# Patient Record
Sex: Female | Born: 1990 | Race: White | Hispanic: No | Marital: Single | Smoking: Current every day smoker
Health system: Southern US, Community
[De-identification: ages and names within clinical notes are randomized; demographics above are authoritative.]

## PROBLEM LIST (undated history)

## (undated) DIAGNOSIS — Q893 Situs inversus: Secondary | ICD-10-CM

## (undated) HISTORY — PX: CARDIAC SURGERY: SHX584

## (undated) HISTORY — PX: HAND SURGERY: SHX662

---

## 2015-08-10 ENCOUNTER — Emergency Department: Payer: Self-pay

## 2015-08-10 ENCOUNTER — Emergency Department
Admission: EM | Admit: 2015-08-10 | Discharge: 2015-08-10 | Disposition: A | Discharge status: Home / Self Care | Payer: Self-pay | Attending: Emergency Medicine | Admitting: Emergency Medicine

## 2015-08-10 ENCOUNTER — Encounter: Payer: Self-pay | Admitting: Emergency Medicine

## 2015-08-10 DIAGNOSIS — M25441 Effusion, right hand: Secondary | ICD-10-CM

## 2015-08-10 DIAGNOSIS — S0990XA Unspecified injury of head, initial encounter: Secondary | ICD-10-CM

## 2015-08-10 DIAGNOSIS — F17210 Nicotine dependence, cigarettes, uncomplicated: Secondary | ICD-10-CM | POA: Insufficient documentation

## 2015-08-10 DIAGNOSIS — Y999 Unspecified external cause status: Secondary | ICD-10-CM | POA: Insufficient documentation

## 2015-08-10 DIAGNOSIS — Y929 Unspecified place or not applicable: Secondary | ICD-10-CM | POA: Insufficient documentation

## 2015-08-10 DIAGNOSIS — M79641 Pain in right hand: Secondary | ICD-10-CM | POA: Insufficient documentation

## 2015-08-10 DIAGNOSIS — S60221A Contusion of right hand, initial encounter: Secondary | ICD-10-CM | POA: Insufficient documentation

## 2015-08-10 DIAGNOSIS — Y9389 Activity, other specified: Secondary | ICD-10-CM | POA: Insufficient documentation

## 2015-08-10 HISTORY — DX: Situs inversus: Q89.3

## 2015-08-10 MED ORDER — ACETAMINOPHEN 325 MG PO TABS
650.0000 mg | ORAL_TABLET | Freq: Once | ORAL | Status: AC
  Administered 2015-08-10: 650 mg via ORAL
  Filled 2015-08-10: qty 2

## 2015-08-10 NOTE — Discharge Instructions (Signed)
Contusion °A contusion is a deep bruise. Contusions are the result of a blunt injury to tissues and muscle fibers under the skin. The injury causes bleeding under the skin. The skin overlying the contusion may turn blue, purple, or yellow. Minor injuries will give you a painless contusion, but more severe contusions may stay painful and swollen for a few weeks.  °CAUSES  °This condition is usually caused by a blow, trauma, or direct force to an area of the body. °SYMPTOMS  °Symptoms of this condition include: °· Swelling of the injured area. °· Pain and tenderness in the injured area. °· Discoloration. The area may have redness and then turn blue, purple, or yellow. °DIAGNOSIS  °This condition is diagnosed based on a physical exam and medical history. An X-ray, CT scan, or MRI may be needed to determine if there are any associated injuries, such as broken bones (fractures). °TREATMENT  °Specific treatment for this condition depends on what area of the body was injured. In general, the best treatment for a contusion is resting, icing, applying pressure to (compression), and elevating the injured area. This is often called the RICE strategy. Over-the-counter anti-inflammatory medicines may also be recommended for pain control.  °HOME CARE INSTRUCTIONS  °· Rest the injured area. °· If directed, apply ice to the injured area: °· Put ice in a plastic bag. °· Place a towel between your skin and the bag. °· Leave the ice on for 20 minutes, 2-3 times per day. °· If directed, apply light compression to the injured area using an elastic bandage. Make sure the bandage is not wrapped too tightly. Remove and reapply the bandage as directed by your health care provider. °· If possible, raise (elevate) the injured area above the level of your heart while you are sitting or lying down. °· Take over-the-counter and prescription medicines only as told by your health care provider. °SEEK MEDICAL CARE IF: °· Your symptoms do not  improve after several days of treatment. °· Your symptoms get worse. °· You have difficulty moving the injured area. °SEEK IMMEDIATE MEDICAL CARE IF:  °· You have severe pain. °· You have numbness in a hand or foot. °· Your hand or foot turns pale or cold. °  °This information is not intended to replace advice given to you by your health care provider. Make sure you discuss any questions you have with your health care provider. °  °Document Released: 11/05/2004 Document Revised: 10/17/2014 Document Reviewed: 06/13/2014 °Elsevier Interactive Patient Education ©2016 [employer] ° °Head Injury, Adult °You have a head injury. Headaches and throwing up (vomiting) are common after a head injury. It should be easy to wake up from sleeping. Sometimes you must stay in the hospital. Most problems happen within the first 24 hours. Side effects may occur up to 7-10 days after the injury.  °WHAT ARE THE TYPES OF HEAD INJURIES? °Head injuries can be as minor as a bump. Some head injuries can be more severe. More severe head injuries include: °· A jarring injury to the brain (concussion). °· A bruise of the brain (contusion). This mean there is bleeding in the brain that can cause swelling. °· A cracked skull (skull fracture). °· Bleeding in the brain that collects, clots, and forms a bump (hematoma). °WHEN SHOULD I GET HELP RIGHT AWAY?  °· You are confused or sleepy. °· You cannot be woken up. °· You feel sick to your stomach (nauseous) or keep throwing up (vomiting). °· Your dizziness or unsteadiness   is getting worse. °· You have very bad, lasting headaches that are not helped by medicine. Take medicines only as told by your doctor. °· You cannot use your arms or legs like normal. °· You cannot walk. °· You notice changes in the black spots in the center of the colored part of your eye (pupil). °· You have clear or bloody fluid coming from your nose or ears. °· You have trouble seeing. °During the next 24 hours after the  injury, you must stay with someone who can watch you. This person should get help right away (call 911 in the [place] if you start to shake and are not able to control it (have seizures), you pass out, or you are unable to wake up. °HOW CAN I PREVENT A HEAD INJURY IN THE FUTURE? °· Wear seat belts. °· Wear a helmet while bike riding and playing sports like football. °· Stay away from dangerous activities around the house. °WHEN CAN I RETURN TO NORMAL ACTIVITIES AND ATHLETICS? °See your doctor before doing these activities. You should not do normal activities or play contact sports until 1 week after the following symptoms have stopped: °· Headache that does not go away. °· Dizziness. °· Poor attention. °· Confusion. °· Memory problems. °· Sickness to your stomach or throwing up. °· Tiredness. °· Fussiness. °· Bothered by bright lights or loud noises. °· Anxiousness or depression. °· Restless sleep. °MAKE SURE YOU:  °· Understand these instructions. °· Will watch your condition. °· Will get help right away if you are not doing well or get worse. °  °This information is not intended to replace advice given to you by your health care provider. Make sure you discuss any questions you have with your health care provider. °  °Document Released: 01/09/2008 Document Revised: 02/16/2014 Document Reviewed: 10/03/2012 °Elsevier Interactive Patient Education ©2016 [employer] ° °
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
## 2015-08-10 NOTE — ED Notes (Signed)
POCT RESULTS: NEGATIVE  

## 2015-08-10 NOTE — ED Notes (Signed)
POC preg negative per previous shift RN
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
